# Patient Record
Sex: Female | Born: 2000 | Race: Black or African American | Hispanic: No | Marital: Single | Smoking: Never smoker
Health system: Southern US, Community
[De-identification: ages and names within clinical notes are randomized; demographics above are authoritative.]

---
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
## 2021-04-04 ENCOUNTER — Ambulatory Visit (INDEPENDENT_AMBULATORY_CARE_PROVIDER_SITE_OTHER): Payer: Medicaid Other

## 2021-04-04 ENCOUNTER — Other Ambulatory Visit: Payer: Self-pay

## 2021-04-04 ENCOUNTER — Ambulatory Visit (HOSPITAL_COMMUNITY)
Admission: EM | Admit: 2021-04-04 | Discharge: 2021-04-04 | Disposition: A | Discharge status: Home / Self Care | Payer: Medicaid Other | Attending: Family Medicine | Admitting: Family Medicine

## 2021-04-04 ENCOUNTER — Encounter (HOSPITAL_COMMUNITY): Payer: Self-pay | Admitting: *Deleted

## 2021-04-04 DIAGNOSIS — R079 Chest pain, unspecified: Secondary | ICD-10-CM | POA: Diagnosis not present

## 2021-04-04 DIAGNOSIS — M546 Pain in thoracic spine: Secondary | ICD-10-CM

## 2021-04-04 MED ORDER — CYCLOBENZAPRINE HCL 10 MG PO TABS: ORAL_TABLET | ORAL | 0 refills | Status: AC

## 2021-04-04 MED ORDER — IBUPROFEN 800 MG PO TABS: 800.0000 mg | ORAL_TABLET | Freq: Three times a day (TID) | ORAL | 0 refills | Status: AC

## 2021-04-04 NOTE — Discharge Instructions (Signed)
HOME CARE INSTRUCTIONS: For many people, back pain returns. Since mid to low back pain is rarely dangerous, it is often a condition that people can learn to manage on their own. Please remain active. It is stressful on the back to sit or stand in one place. Do not sit, drive, or stand in one place for more than 30 minutes at a time. Take short walks on level surfaces as soon as pain allows. Try to increase the length of time you walk each day. Do not stay in bed. Resting more than 1 or 2 days can delay your recovery. Do not avoid exercise or work. Your body is made to move. It is not dangerous to be active, even though your back may hurt. Your back will likely heal faster if you return to being active before your pain is gone. Over-the-counter medicines to reduce pain and inflammation are often the most helpful. ? ?SEEK MEDICAL CARE IF: ?You have pain that is not relieved with rest or medicine. ?You have pain that does not improve in 1 week. ?You have new symptoms. ?You are generally not feeling well. ? ?SEEK IMMEDIATE MEDICAL CARE IF: ?You have pain that radiates from your back into your legs. ?You develop new bowel or bladder control problems. ?You have unusual weakness or numbness in your arms or legs. ?You develop nausea or vomiting. ?You develop abdominal pain. ?You feel faint. ? ? ? ?

## 2021-04-04 NOTE — ED Triage Notes (Signed)
Pt reports for one month she has has been waking up with back pain and chest pain.Pt also reports HA.  ?

## 2021-04-05 NOTE — ED Provider Notes (Signed)
?[facility] ? ? ?[id number] ?04/04/21 Arrival Time: 1833 ? ?ASSESSMENT & PLAN: ? ?1. Acute bilateral thoracic back pain   ? ?Able to ambulate here and hemodynamically stable. ?I have personally viewed the imaging studies ordered this visit. ?Normal CXR; no pneumothorax. ? ?Likely MSK; discussed. ?Begin trial of: ?Meds ordered this encounter  ?Medications  ? cyclobenzaprine (FLEXERIL) 10 MG tablet  ?  Sig: Take 1 tablet by mouth 3 times daily as needed for muscle spasm. Warning: May cause drowsiness.  ?  Dispense:  21 tablet  ?  Refill:  0  ? ibuprofen (ADVIL) 800 MG tablet  ?  Sig: Take 1 tablet (800 mg total) by mouth 3 (three) times daily with meals.  ?  Dispense:  21 tablet  ?  Refill:  0  ? ?Medication sedation precautions given. ?Encourage ROM/movement as tolerated. ? ?Recommend: ? Follow-up Information   ? ? [facility] [place].   ?Specialty: Urgent Care ?Why: If worsening or failing to improve as anticipated. ?Contact information: ?[address] ?[place] [address] ?[phone] ? ?  ?  ? ?  ?  ? ?  ? ? ?Reviewed expectations re: course of current medical issues. Questions answered. ?Outlined signs and symptoms indicating need for more acute intervention. ?Patient verbalized understanding. ?After Visit Summary given. ? ? ?SUBJECTIVE: ?History from: patient. ? ?Joanna Tran is a 21 y.o. female who presents with complaint of intermittent bilateral thoracic back discomfort with occas SOB/CP; none currently. Onset gradual. First noted  approx 4 weeks ago . Injury/trama: no. History of back problems requiring medical care: reports none. Pain described as aching and without radiation. ?Aggravating factors: have not been identified. Alleviating factors: have not been identified. Progressive LE weakness or saddle anesthesia: none. Extremity sensation changes or weakness: none. Ambulatory without difficulty. Normal bowel/bladder habits: yes; without urinary  retention. Normal PO intake without n/v. No associated abdominal pain/n/v. Self treatment: has has not tried OTC therapies. Also mentions occasional and mild HA relieved by ibuprofen. None currently. ?Reports no chronic steroid use, fevers, IV drug use, or recent back surgeries or procedures. ? ? ?OBJECTIVE: ? ?Vitals:  ? 04/04/21 1856  ?BP: 131/78  ?Pulse: 90  ?Resp: 20  ?Temp: 98.2 ?F (36.8 ?C)  ?SpO2: 95%  ?  ?General appearance: alert; no distress ?HEENT: [place]; AT ?Neck: supple with FROM ?Lungs: unlabored respirations; speaks full sentences without difficulty ?Abdomen: soft, non-tender; non-distended ?Back: mild  and poorly localized tenderness to palpation over thoracic paraspinal musculature ; FROM at waist; bruising: none; without midline tenderness ?Extremities: without edema; symmetrical without gross deformities; normal ROM of bilateral LE ?Skin: warm and dry ?Neurologic: normal gait; normal sensation and strength of all extremities ?Psychological: alert and cooperative; normal mood and affect ? ?Imaging: ?DG Chest 2 View ? ?Result Date: 04/04/2021 ?CLINICAL DATA:  Chest pain EXAM: CHEST - 2 VIEW COMPARISON:  None. FINDINGS: The heart size and mediastinal contours are within normal limits. Both lungs are clear. The visualized skeletal structures are unremarkable. IMPRESSION: No active cardiopulmonary disease. Electronically Signed   By: Helyn Numbers M.D.   On: 04/04/2021 19:22  ? ? ?No Known Allergies ? ?History reviewed. No pertinent past medical history. ?Social History  ? ?Socioeconomic History  ? Marital status: Single  ?  Spouse name: Not on file  ? Number of children: Not on file  ? Years of education: Not on file  ? Highest education level: Not on file  ?Occupational History  ?  Not on file  ?Tobacco Use  ? Smoking status: Never  ?  Passive exposure: Never  ? Smokeless tobacco: Never  ?Vaping Use  ? Vaping Use: Never used  ?Substance and Sexual Activity  ? Alcohol use: Never  ? Drug use: Never  ?  Sexual activity: Not Currently  ?  Birth control/protection: Implant  ?Other Topics Concern  ? Not on file  ?Social History Narrative  ? Not on file  ? ?Social Determinants of Health  ? ?Financial Resource Strain: Not on file  ?Food Insecurity: Not on file  ?Transportation Needs: Not on file  ?Physical Activity: Not on file  ?Stress: Not on file  ?Social Connections: Not on file  ?Intimate Partner Violence: Not on file  ? ?History reviewed. No pertinent family history. ?History reviewed. No pertinent surgical history. ? ?  ?Mardella Layman, MD ?04/05/21 1118 ? ?

## 2023-02-28 IMAGING — DX DG CHEST 2V
2 series · 2 of 2 positions shown · non-contrast
Comparison: None.

CLINICAL DATA: Chest pain

EXAM:
CHEST - 2 VIEW

[chest pa]
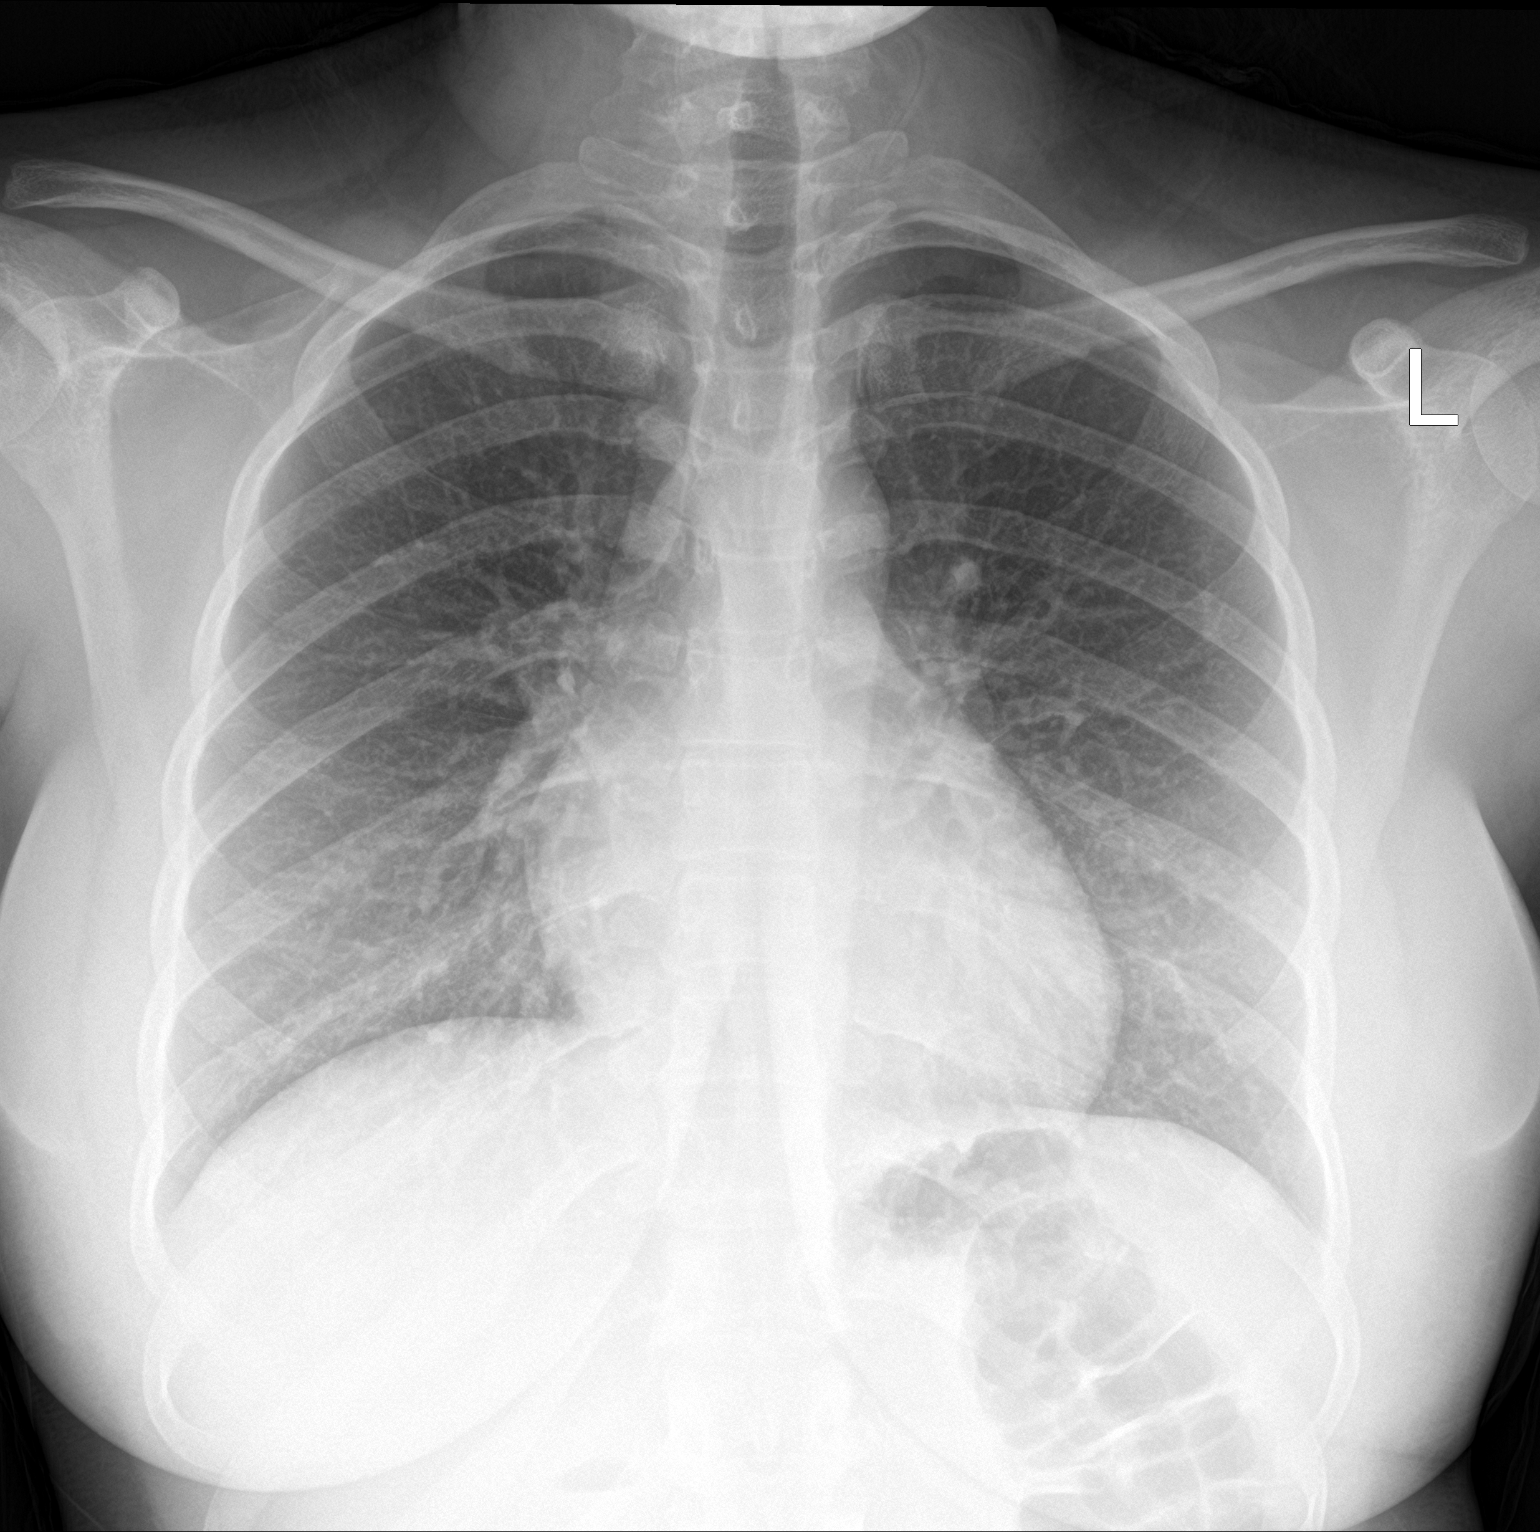

[chest lat]
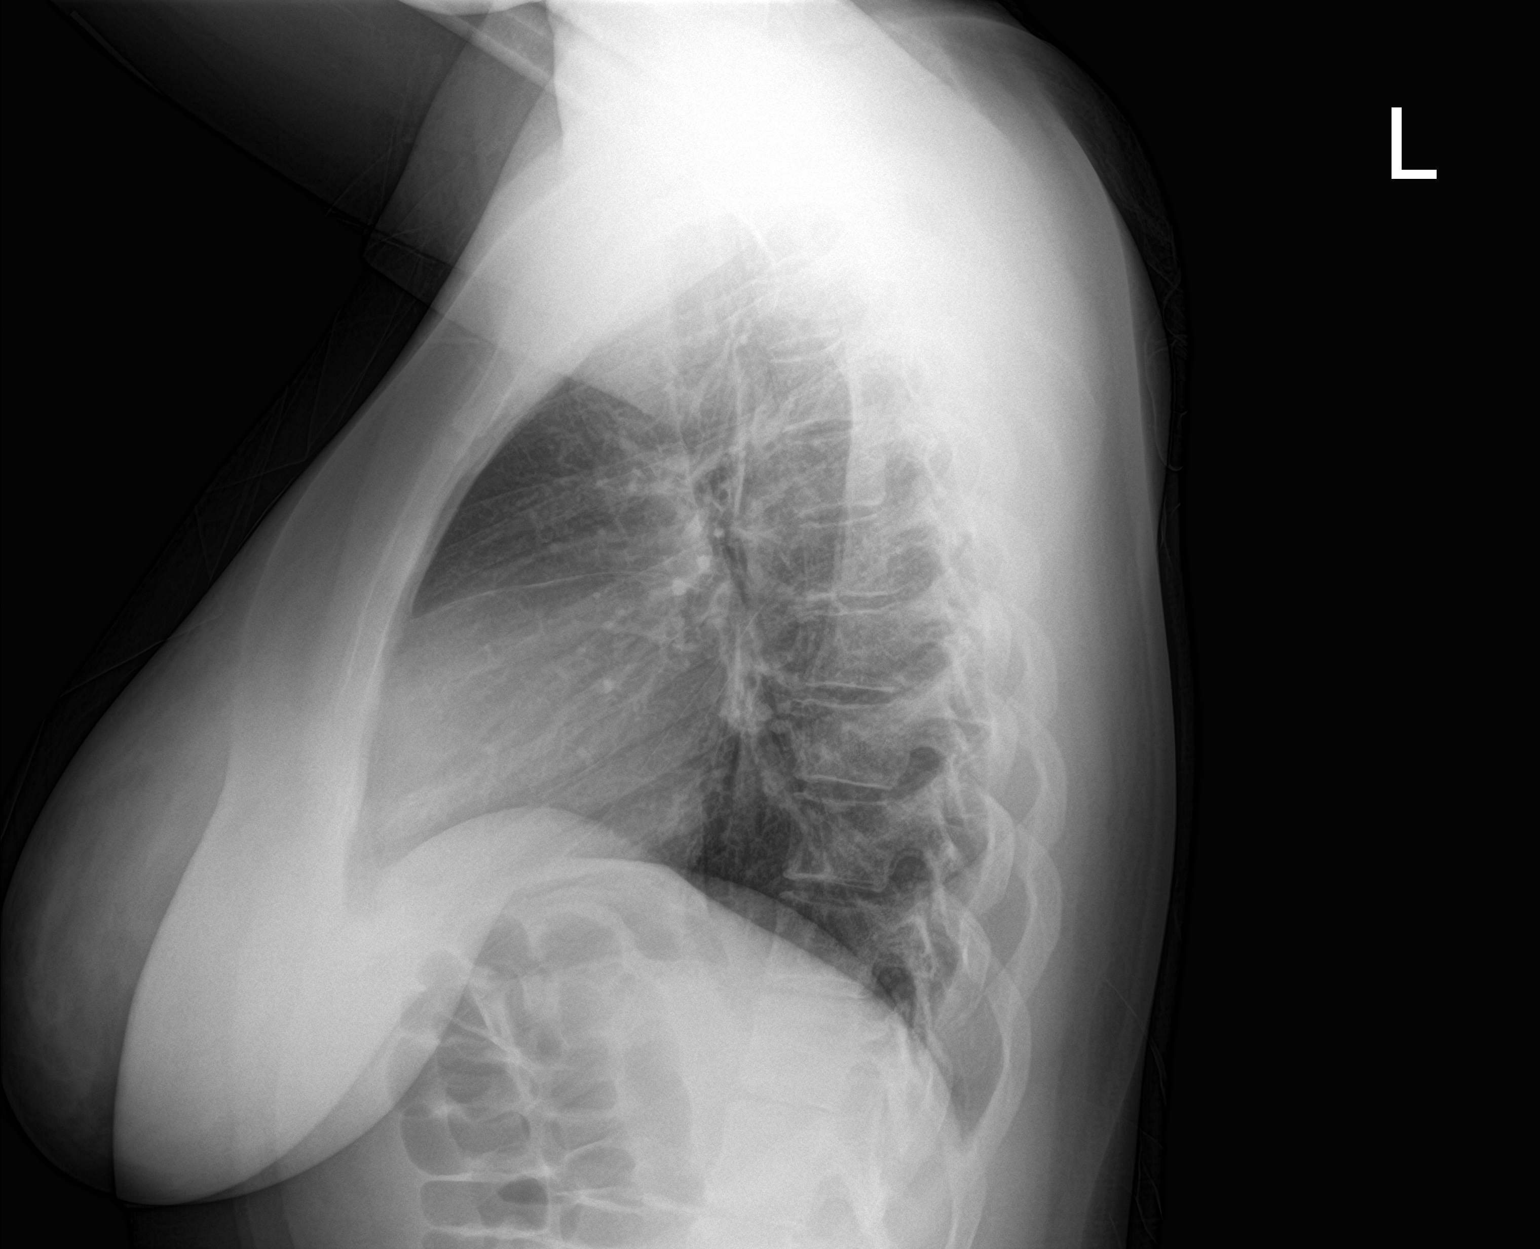

[2 of 2 positions shown; findings below may reference images not displayed]

FINDINGS: The heart size and mediastinal contours are within normal limits.
Both lungs are clear. The visualized skeletal structures are
unremarkable.
IMPRESSION: No active cardiopulmonary disease.
